# Patient Record
Sex: Female | Born: 2005 | Race: White | Hispanic: No | Marital: Single | Smoking: Never smoker
Health system: Southern US, Community
[De-identification: ages and names within clinical notes are randomized; demographics above are authoritative.]

---
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
## 2024-05-07 ENCOUNTER — Emergency Department (HOSPITAL_BASED_OUTPATIENT_CLINIC_OR_DEPARTMENT_OTHER)
Admission: EM | Admit: 2024-05-07 | Discharge: 2024-05-07 | Disposition: A | Discharge status: Home / Self Care | Attending: Emergency Medicine | Admitting: Emergency Medicine

## 2024-05-07 ENCOUNTER — Other Ambulatory Visit: Payer: Self-pay

## 2024-05-07 ENCOUNTER — Emergency Department (HOSPITAL_BASED_OUTPATIENT_CLINIC_OR_DEPARTMENT_OTHER)

## 2024-05-07 DIAGNOSIS — M545 Low back pain, unspecified: Secondary | ICD-10-CM | POA: Diagnosis present

## 2024-05-07 DIAGNOSIS — M546 Pain in thoracic spine: Secondary | ICD-10-CM | POA: Diagnosis not present

## 2024-05-07 DIAGNOSIS — N390 Urinary tract infection, site not specified: Secondary | ICD-10-CM | POA: Insufficient documentation

## 2024-05-07 LAB — URINALYSIS, ROUTINE W REFLEX MICROSCOPIC
Bilirubin Urine: NEGATIVE
Glucose, UA: NEGATIVE mg/dL
Ketones, ur: NEGATIVE mg/dL
Nitrite: NEGATIVE
Protein, ur: 30 mg/dL — AB
RBC / HPF: >50 RBC/hpf (ref 0–5)
Specific Gravity, Urine: 1.022 (ref 1.005–1.030)
WBC, UA: >50 WBC/hpf (ref 0–5)
pH: 6.5 (ref 5.0–8.0)

## 2024-05-07 LAB — PREGNANCY, URINE: Preg Test, Ur: NEGATIVE

## 2024-05-07 MED ORDER — CYCLOBENZAPRINE HCL 10 MG PO TABS: 10.0000 mg | ORAL_TABLET | Freq: Two times a day (BID) | ORAL | 0 refills | Status: AC | PRN

## 2024-05-07 MED ORDER — FLUCONAZOLE 200 MG PO TABS: 200.0000 mg | ORAL_TABLET | Freq: Every day | ORAL | 0 refills | Status: AC | PRN

## 2024-05-07 MED ORDER — OXYCODONE-ACETAMINOPHEN 5-325 MG PO TABS
1.0000 | ORAL_TABLET | Freq: Once | ORAL | Status: AC
  Administered 2024-05-07: 1 via ORAL
  Filled 2024-05-07: qty 1

## 2024-05-07 MED ORDER — CEPHALEXIN 250 MG PO CAPS
500.0000 mg | ORAL_CAPSULE | Freq: Once | ORAL | Status: AC
Start: 2024-05-07 — End: 2024-05-07
  Administered 2024-05-07: 500 mg via ORAL
  Filled 2024-05-07: qty 2

## 2024-05-07 MED ORDER — IBUPROFEN 600 MG PO TABS: 600.0000 mg | ORAL_TABLET | Freq: Four times a day (QID) | ORAL | 0 refills | Status: AC | PRN

## 2024-05-07 MED ORDER — CEPHALEXIN 500 MG PO CAPS: 500.0000 mg | ORAL_CAPSULE | Freq: Four times a day (QID) | ORAL | 0 refills | Status: AC

## 2024-05-07 NOTE — Discharge Instructions (Signed)
 You are being treated for potential urinary tract infection.  Please take the antibiotics 4 times a day as prescribed and complete the full course.  Please schedule follow-up appoint with your primary care provider's office.  Your urine culture will take about 3 to 4 days to result.  In the meantime, you can take the ibuprofen prescribed, as well as Tylenol  the same time as needed for pain.  You can also try the muscle relaxer Flexeril as needed, twice a day.  This can make some people drowsy, so I recommend taking it at night, do not drive after taking it.  We discussed also the possibility that you have nerve pain coming from your back, potentially a pinched nerve or muscle spasm related to your back.  This type of pain typically gets better gradually over 2 to 4 weeks.  Ibuprofen can also help with this.  If you have worsening pain, persistent vomiting, fevers, or any other emergency concerns, he should return to the emergency department.

## 2024-05-07 NOTE — ED Notes (Signed)
 Lab called to add on urine culture.  ?

## 2024-05-07 NOTE — ED Provider Notes (Signed)
 [place] EMERGENCY DEPARTMENT AT [facility] Provider Note   CSN: [id number] Arrival date & time: 05/07/24  9343     Patient presents with: Back Pain   Samantha Taylor is a 18 y.o. female presenting to ED with acute onset of lower back pain, that began this morning.  She reports pressure with urination.  She denies that she has had this pain for.  Denies history of kidney stones, denies active hematuria, denies any history of abdominal surgery.  Denies vomiting.  She took ibuprofen at 4 AM.  She is here with her mother.   HPI     Prior to Admission medications   Medication Sig Start Date End Date Taking? Authorizing Provider  cephALEXin (KEFLEX) 500 MG capsule Take 1 capsule (500 mg total) by mouth 4 (four) times daily for 7 days. 05/07/24 05/14/24 Yes Alyan Hartline, Donnice PARAS, MD  cyclobenzaprine (FLEXERIL) 10 MG tablet Take 1 tablet (10 mg total) by mouth 2 (two) times daily as needed for up to 12 doses for muscle spasms. 05/07/24  Yes Cottie Donnice PARAS, MD  fluconazole (DIFLUCAN) 200 MG tablet Take 1 tablet (200 mg total) by mouth daily as needed for up to 2 doses. 05/07/24  Yes Cottie Donnice PARAS, MD  ibuprofen (ADVIL) 600 MG tablet Take 1 tablet (600 mg total) by mouth every 6 (six) hours as needed for up to 24 doses. 05/07/24  Yes Tre Sanker, Donnice PARAS, MD  Acetaminophen  (TYLENOL  CHILDRENS PO) Take by mouth every 8 (eight) hours as needed.    [provider]  CHILDS IBUPROFEN PO Take by mouth every 8 (eight) hours as needed.    [provider]  dicyclomine  (BENTYL ) 10 MG/5ML syrup Take 2.5 mLs (5 mg total) by mouth every 8 (eight) hours. 07/29/13 07/31/13  Levy Bone, DO    Allergies: Patient has no known allergies.    Review of Systems  Updated Vital Signs BP (!) 134/93 (BP Location: Right Arm)   Pulse 84   Temp 98.3 F (36.8 C) (Oral)   Resp 18   SpO2 99%   Physical Exam Constitutional:      General: She is not in acute distress. HENT:     Head:  Normocephalic and atraumatic.  Eyes:     Conjunctiva/sclera: Conjunctivae normal.     Pupils: Pupils are equal, round, and reactive to light.  Cardiovascular:     Rate and Rhythm: Normal rate and regular rhythm.  Pulmonary:     Effort: Pulmonary effort is normal. No respiratory distress.  Abdominal:     General: There is no distension.     Tenderness: There is no abdominal tenderness. There is right CVA tenderness and left CVA tenderness.  Skin:    General: Skin is warm and dry.  Neurological:     General: No focal deficit present.     Mental Status: She is alert. Mental status is at baseline.  Psychiatric:        Mood and Affect: Mood normal.        Behavior: Behavior normal.     (all labs ordered are listed, but only abnormal results are displayed) Labs Reviewed  URINALYSIS, ROUTINE W REFLEX MICROSCOPIC - Abnormal; Notable for the following components:      Result Value   APPearance HAZY (*)    Hgb urine dipstick MODERATE (*)    Protein, ur 30 (*)    Leukocytes,Ua LARGE (*)    Bacteria, UA RARE (*)    All other components within normal  limits  URINE CULTURE  PREGNANCY, URINE    EKG: None  Radiology: CT Renal Stone Study Result Date: 05/07/2024 EXAM: CT UROGRAM 05/07/2024 08:22:17 AM TECHNIQUE: CT of the abdomen and pelvis was performed without the administration of intravenous contrast as per CT urogram protocol. Multiplanar reformatted images as well as MIP urogram images are provided for review. Automated exposure control, iterative reconstruction, and/or weight based adjustment of the mA/kV was utilized to reduce the radiation dose to as low as reasonably achievable. COMPARISON: 07/29/2013 CLINICAL HISTORY: Flank pain acute onset with urinary pressure, no clear laterality. Triage note: Reports lower back pain since yesterday. Denies injury. FINDINGS: LOWER CHEST: No acute abnormality. LIVER: The liver is unremarkable. GALLBLADDER AND BILE DUCTS: Gallbladder is  unremarkable. No biliary ductal dilatation. SPLEEN: No acute abnormality. PANCREAS: No acute abnormality. ADRENAL GLANDS: No acute abnormality. KIDNEYS, URETERS AND BLADDER: No stones in the kidneys or ureters. No hydronephrosis. No perinephric or periureteral stranding. Urinary bladder is unremarkable. GI AND BOWEL: Stomach demonstrates no acute abnormality. There is no bowel obstruction. PERITONEUM AND RETROPERITONEUM: No ascites. No free air. VASCULATURE: Aorta is normal in caliber. LYMPH NODES: No lymphadenopathy. REPRODUCTIVE ORGANS: No acute abnormality. BONES AND SOFT TISSUES: No acute osseous abnormality. No focal soft tissue abnormality. IMPRESSION: 1. No nephrolithiasis. 2. No obstructive uropathy. 3. No urothelial mass. Electronically signed by: Lynwood Seip MD 05/07/2024 08:34 AM EDT RP Workstation: [id number]     Procedures   Medications Ordered in the ED  cephALEXin (KEFLEX) capsule 500 mg (has no administration in time range)  oxyCODONE-acetaminophen  (PERCOCET/ROXICET) 5-325 MG per tablet 1 tablet (1 tablet Oral Given 05/07/24 0740)                                    Medical Decision Making Amount and/or Complexity of Data Reviewed Labs: ordered. Radiology: ordered.  Risk Prescription drug management.   Differential include musculoskeletal pain or lumbar spasms versus ureteral colic versus colitis or intra-abdominal process versus other  I personally reviewed the patient's labs, urine, and imaging, notable for possible urinary tract infection, urine culture will be sent off.  CT imaging with no emergent findings.  Patient was given Percocet on reassessment had significant improvement of her pain.  Mother was present at the bedside to provide supplemental history.  I doubt pelvic pathology, doubt ovarian torsion or PID.  No indication for emergent pelvic ultrasound.  I doubt acute biliary colic or intra-abdominal cause of her symptoms, given normal CT findings and no  report of nausea.  However, ED return precautions were discussed with the patient and her mother  I think it is reasonable to treat for potential urinary tract infection, possibly kidney infection and given the location of her flank pain.  No obvious signs of pyelonephritis or sepsis.  CT imaging or workup, or her vital signs.  We will start her on Keflex 4 times a day.  Also offered a muscle relaxer as explained this may also be musculoskeletal back pain, given the acuity and sharpness of it.  I recommended continue ibuprofen and Tylenol  at home.  Recommended PCP follow-up.     Final diagnoses:  Urinary tract infection without hematuria, site unspecified  Bilateral thoracic back pain, unspecified chronicity    ED Discharge Orders          Ordered    cephALEXin (KEFLEX) 500 MG capsule  4 times daily  05/07/24 0918    fluconazole (DIFLUCAN) 200 MG tablet  Daily PRN        05/07/24 0918    ibuprofen (ADVIL) 600 MG tablet  Every 6 hours PRN        05/07/24 0918    cyclobenzaprine (FLEXERIL) 10 MG tablet  2 times daily PRN        05/07/24 0918               Cottie Donnice PARAS, MD 05/07/24 [phone]

## 2024-05-07 NOTE — ED Triage Notes (Signed)
 Reports lower back pain since yesterday. Denies injury. States a lot of pressure when urinating.

## 2024-05-09 LAB — URINE CULTURE: Culture: >=100000 — AB

## 2024-05-10 ENCOUNTER — Telehealth (HOSPITAL_COMMUNITY): Payer: Self-pay | Admitting: Emergency Medicine

## 2024-05-10 MED ORDER — SULFAMETHOXAZOLE-TRIMETHOPRIM 800-160 MG PO TABS: 1.0000 | ORAL_TABLET | Freq: Two times a day (BID) | ORAL | 0 refills | Status: AC

## 2024-05-10 NOTE — Progress Notes (Signed)
 ED Antimicrobial Stewardship Positive Culture Follow Up   Samantha Taylor is an 17 y.o. female who presented to [facility] on 05/07/2024 with a chief complaint of  Chief Complaint  Patient presents with   Back Pain    Recent Results (from the past 720 hours)  Urine Culture     Status: Abnormal   Collection Time: 05/07/24  7:35 AM   Specimen: Urine, Clean Catch  Result Value Ref Range Status   Specimen Description   Final    URINE, CLEAN CATCH Performed at [facility], [address] [place], [place] [address]    Special Requests   Final    NONE Performed at [facility], [address] [place], [place] [address]    Culture >=100,000 COLONIES/mL STAPHYLOCOCCUS SAPROPHYTICUS (A)  Final   Report Status 05/09/2024 FINAL  Final   Organism ID, Bacteria STAPHYLOCOCCUS SAPROPHYTICUS (A)  Final      Susceptibility   Staphylococcus saprophyticus - MIC*    CIPROFLOXACIN <=0.5 SENSITIVE Sensitive     GENTAMICIN <=0.5 SENSITIVE Sensitive     NITROFURANTOIN <=16 SENSITIVE Sensitive     OXACILLIN SENSITIVE Sensitive     TETRACYCLINE <=1 SENSITIVE Sensitive     VANCOMYCIN 1 SENSITIVE Sensitive     TRIMETH/SULFA <=10 SENSITIVE Sensitive     RIFAMPIN <=0.5 SENSITIVE Sensitive     Inducible Clindamycin NEGATIVE Sensitive     * >=100,000 COLONIES/mL STAPHYLOCOCCUS SAPROPHYTICUS    [x]  Treated with cephalexin, organism resistant to prescribed antimicrobial  New antibiotic prescription: Bactrim DS 1 tablet po BID x 10 days  ED Provider: Dr. Rogelia Vito Ralph, PharmD, BCPS 05/10/2024, 3:00 PM Clinical Pharmacist Monday - Friday phone -  [phone] Saturday - Sunday phone - [phone]

## 2024-05-10 NOTE — Telephone Encounter (Signed)
 Called back patient regarding her recent urine culture which demonstrated Staphylococcus saprophyticus.  ED pharmacist recommended discontinuation of Keflex for Bactrim instead.  I called the patient, patient confirmed name and date of birth for privacy.  She reports that her symptoms are overall improving.  Discussed culture data, discussed switching antibiotics from Keflex to Bactrim, patient is amenable to this plan, prescription sent to her preferred pharmacy.  Return precautions reviewed.
